# Patient Record
Sex: Male | Born: 1970 | Race: White | Hispanic: No | Marital: Single | Smoking: Current every day smoker
Health system: Southern US, Community
[De-identification: ages and names within clinical notes are randomized; demographics above are authoritative.]

## PROBLEM LIST (undated history)

## (undated) HISTORY — PX: BRAIN SURGERY: SHX531

---

## 2009-02-19 ENCOUNTER — Emergency Department: Payer: Self-pay | Admitting: Emergency Medicine

## 2009-02-20 ENCOUNTER — Emergency Department: Payer: Self-pay | Admitting: Internal Medicine

## 2009-10-26 ENCOUNTER — Emergency Department: Payer: Self-pay | Admitting: Internal Medicine

## 2011-09-08 ENCOUNTER — Emergency Department: Payer: Self-pay | Admitting: Internal Medicine

## 2013-02-03 IMAGING — CT CT HEAD WITHOUT CONTRAST
2 series · 16 of 30 positions shown, 20 images · non-contrast
Comparison: none

REASON FOR EXAM: Headache/ vomiting and photophobia        Flex 4
COMMENTS:   LMP: (Male)

PROCEDURE:     CT  - CT HEAD WITHOUT CONTRAST  - September 08, 2011 [DATE]
RESULT:     Technique: Helical 5mm sections were obtained from the skull
base to the vertex without administration of intravenous contrast.

[Series 2: without · axial · non-contrast · 0.40mm/px · z∈[+533,+658]mm · 13 of 31 slices shown, 17 images]
[im 3/31  brain]
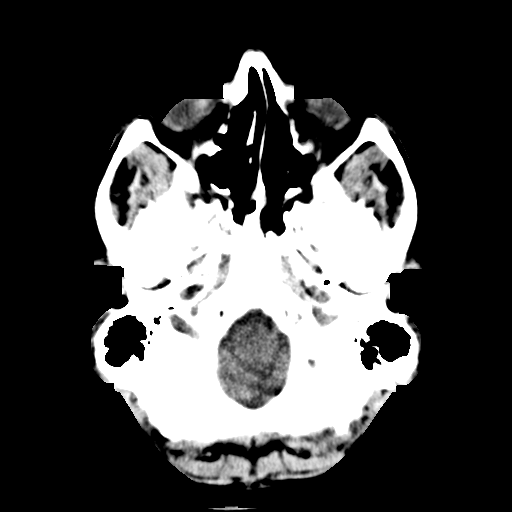
[im 3/31  bone]
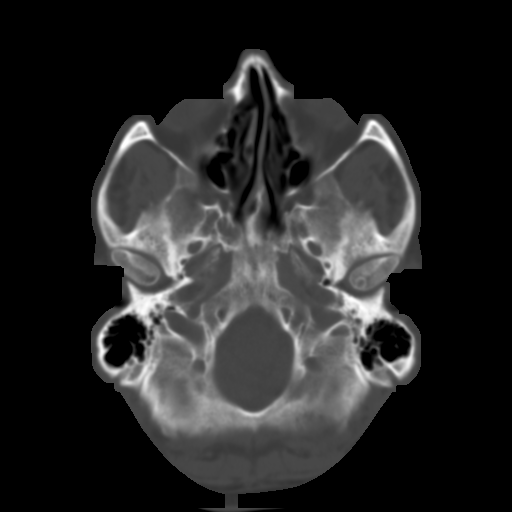
[im 5/31  brain]
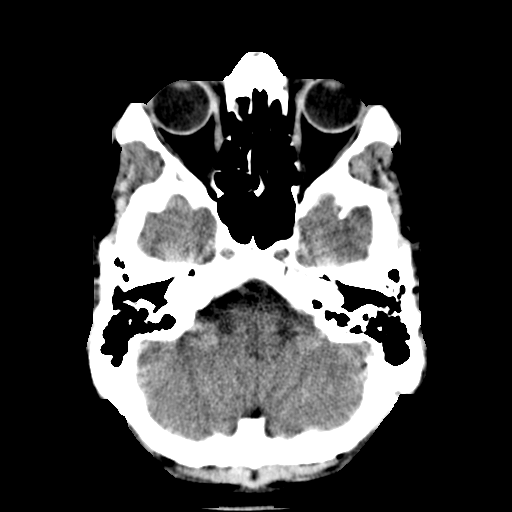
[im 7/31  brain]
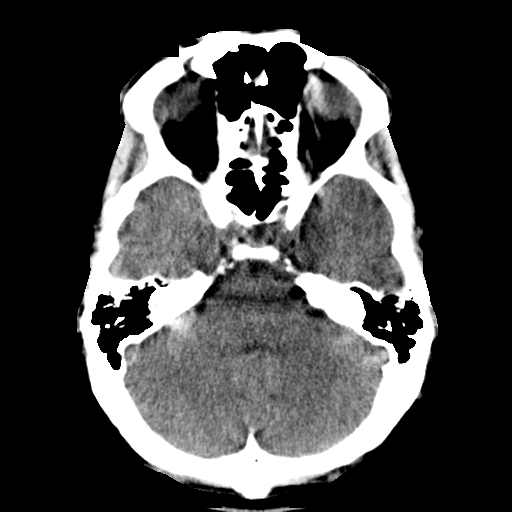
[im 9/31  brain]
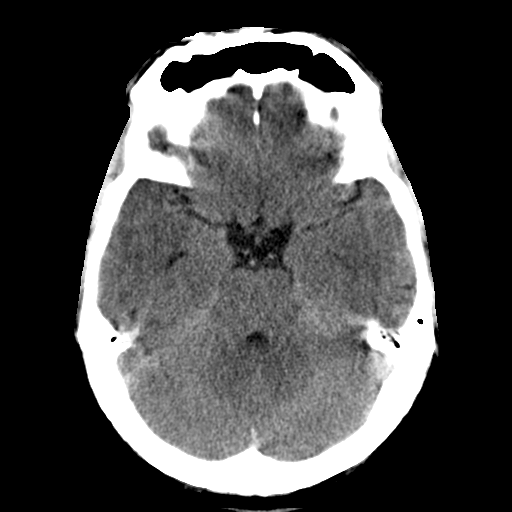
[im 11/31  brain]
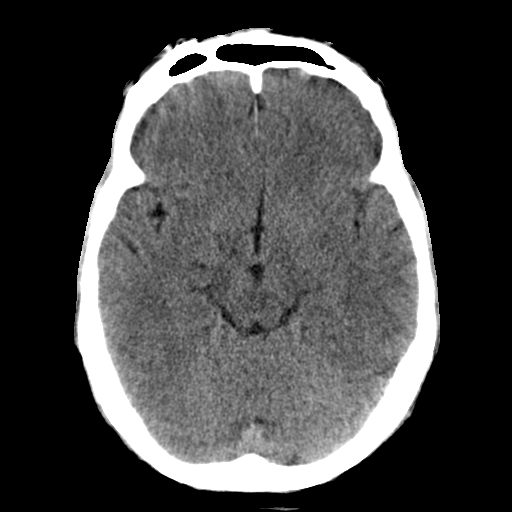
[im 11/31  bone]
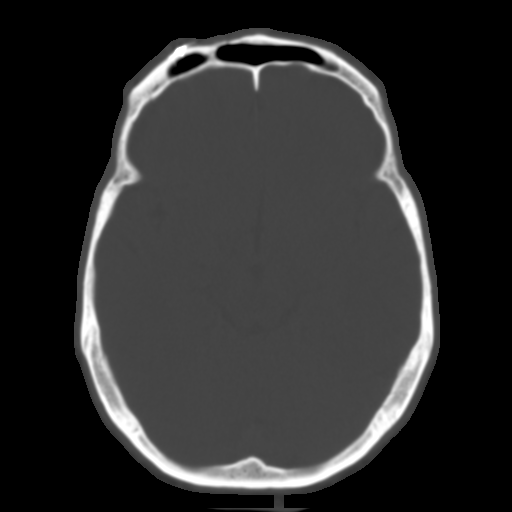
[im 13/31  brain]
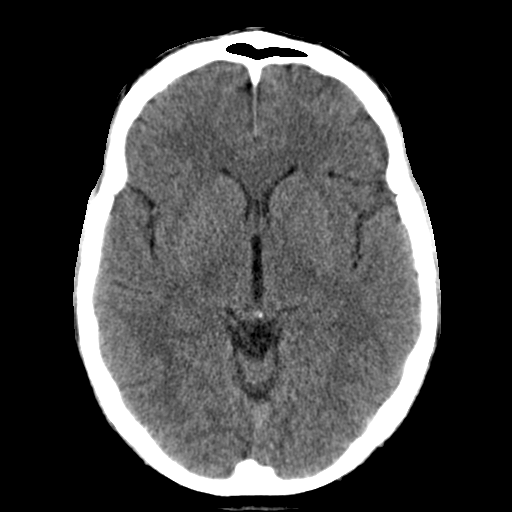
[im 16/31  brain]
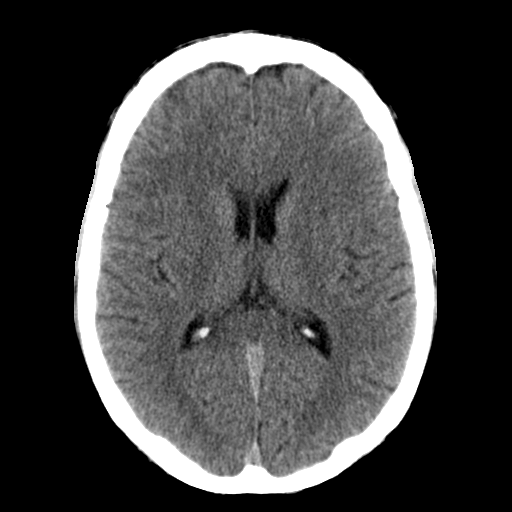
[im 18/31  brain]
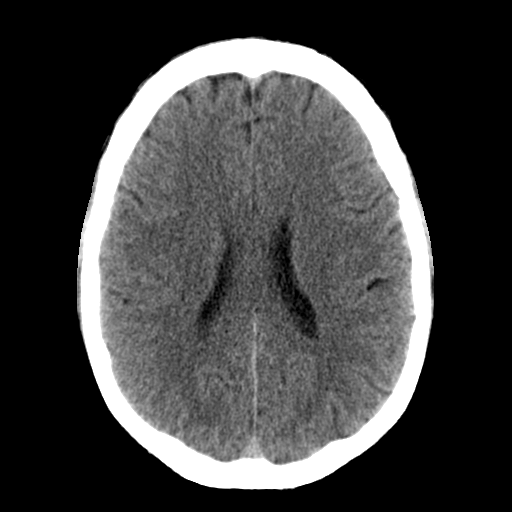
[im 20/31  brain]
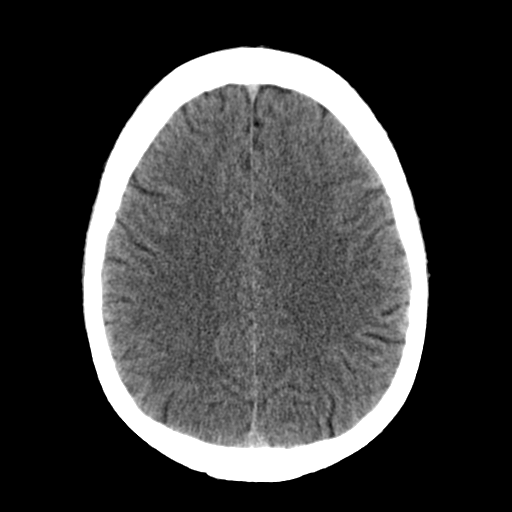
[im 20/31  bone]
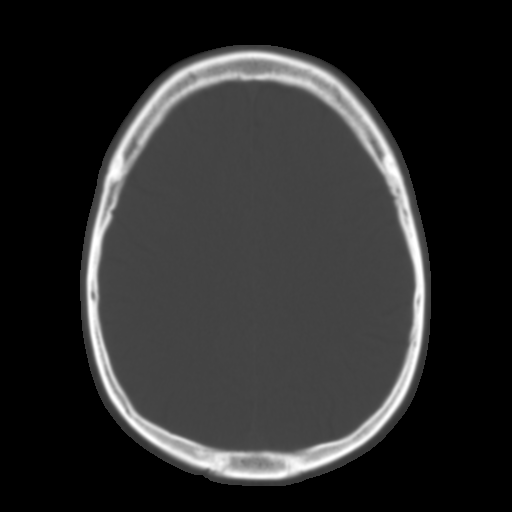
[im 22/31  brain]
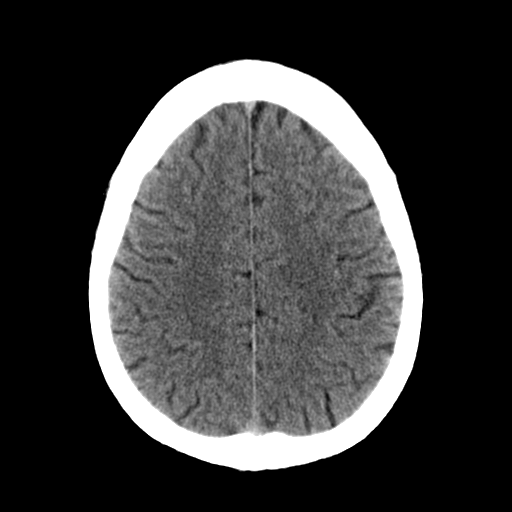
[im 24/31  brain]
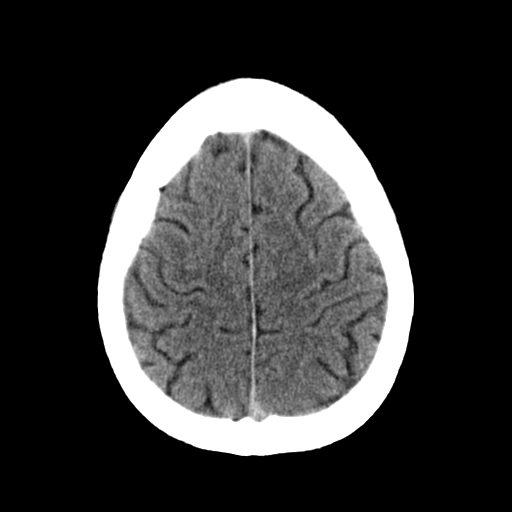
[im 26/31  brain]
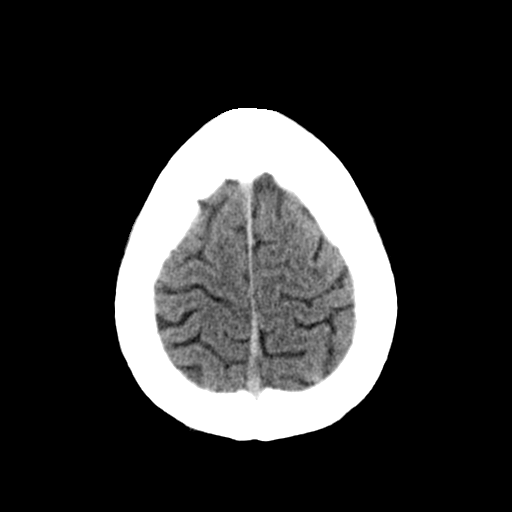
[im 28/31  brain]
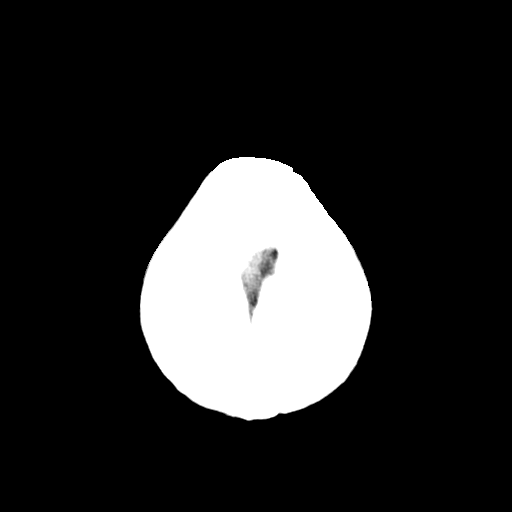
[im 28/31  bone]
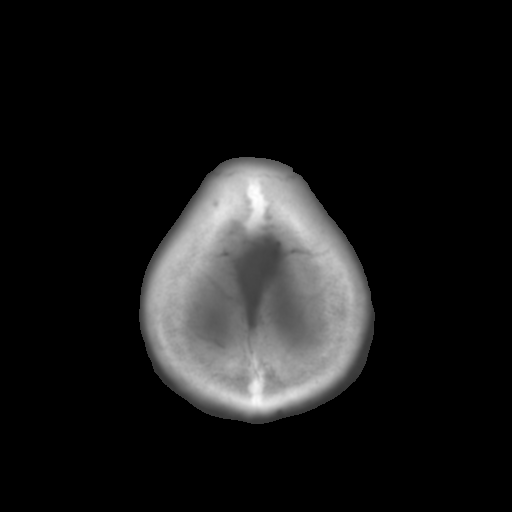

[Series 3: bone · axial · 0.40mm/px · z∈[+533,+573]mm · 3 of 31 slices shown]
[im 3/31  bone]
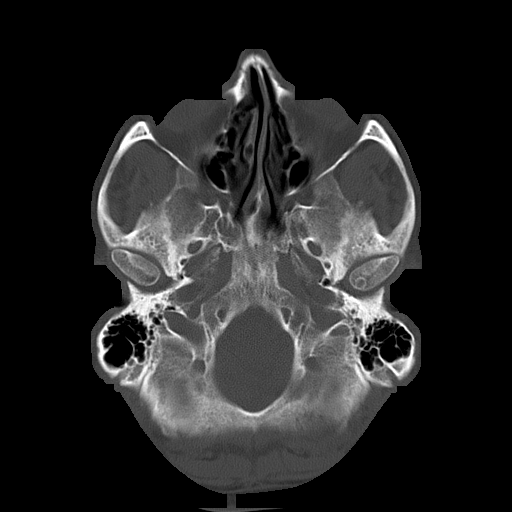
[im 7/31  bone]
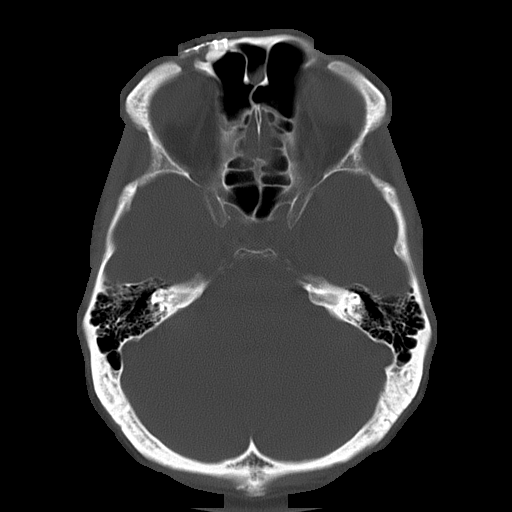
[im 11/31  bone]
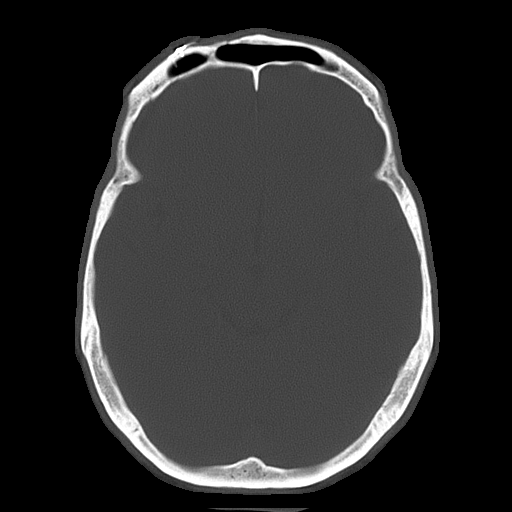

[16 of 30 positions shown; findings below may reference images not displayed]

FINDINGS: There is not evidence of intra-axial fluid collections. There is
no evidence of acute hemorrhage or secondary signs reflecting mass effect or
subacute or chronic focal territorial infarction. The osseous structures
demonstrate no evidence of a depressed skull fracture. Plate and screw
fixation is identified within the right supraorbital region. These findings
appear chronic. If there is persistent concern clinical follow-up with MRI
is recommended.
IMPRESSION: 1. No evidence of acute intracranial abnormalitites.

## 2016-09-15 ENCOUNTER — Emergency Department
Admission: EM | Admit: 2016-09-15 | Discharge: 2016-09-15 | Disposition: A | Discharge status: Home / Self Care | Payer: Self-pay | Attending: Emergency Medicine | Admitting: Emergency Medicine

## 2016-09-15 ENCOUNTER — Encounter: Payer: Self-pay | Admitting: Emergency Medicine

## 2016-09-15 ENCOUNTER — Emergency Department: Payer: Self-pay

## 2016-09-15 DIAGNOSIS — Y999 Unspecified external cause status: Secondary | ICD-10-CM | POA: Insufficient documentation

## 2016-09-15 DIAGNOSIS — Y9389 Activity, other specified: Secondary | ICD-10-CM | POA: Insufficient documentation

## 2016-09-15 DIAGNOSIS — Z23 Encounter for immunization: Secondary | ICD-10-CM | POA: Insufficient documentation

## 2016-09-15 DIAGNOSIS — S61412A Laceration without foreign body of left hand, initial encounter: Secondary | ICD-10-CM | POA: Insufficient documentation

## 2016-09-15 DIAGNOSIS — W231XXA Caught, crushed, jammed, or pinched between stationary objects, initial encounter: Secondary | ICD-10-CM | POA: Insufficient documentation

## 2016-09-15 DIAGNOSIS — Y929 Unspecified place or not applicable: Secondary | ICD-10-CM | POA: Insufficient documentation

## 2016-09-15 DIAGNOSIS — F17200 Nicotine dependence, unspecified, uncomplicated: Secondary | ICD-10-CM | POA: Insufficient documentation

## 2016-09-15 MED ORDER — TETANUS-DIPHTH-ACELL PERTUSSIS 5-2.5-18.5 LF-MCG/0.5 IM SUSP
0.5000 mL | Freq: Once | INTRAMUSCULAR | Status: AC
  Administered 2016-09-15: 0.5 mL via INTRAMUSCULAR
  Filled 2016-09-15: qty 0.5

## 2016-09-15 MED ORDER — HYDROCODONE-ACETAMINOPHEN 5-325 MG PO TABS: 1.0000 | ORAL_TABLET | Freq: Four times a day (QID) | ORAL | 0 refills | Status: AC | PRN

## 2016-09-15 MED ORDER — CEPHALEXIN 500 MG PO CAPS: 500.0000 mg | ORAL_CAPSULE | Freq: Three times a day (TID) | ORAL | 0 refills | Status: AC

## 2016-09-15 MED ORDER — LIDOCAINE HCL (PF) 1 % IJ SOLN
10.0000 mL | Freq: Once | INTRAMUSCULAR | Status: AC
  Administered 2016-09-15: 10 mL
  Filled 2016-09-15: qty 10

## 2016-09-15 MED ORDER — HYDROCODONE-ACETAMINOPHEN 5-325 MG PO TABS
1.0000 | ORAL_TABLET | Freq: Once | ORAL | Status: AC
  Administered 2016-09-15: 1 via ORAL
  Filled 2016-09-15: qty 1

## 2016-09-15 NOTE — ED Triage Notes (Addendum)
Laceration to left hand.  Says hand got caught between a ground rod and a driver.  Does not want to file worker comp

## 2016-09-15 NOTE — ED Provider Notes (Signed)
[facility] Emergency Department Provider Note  ____________________________________________  Time seen: Approximately 3:20 PM  I have reviewed the triage vital signs and the nursing notes.   HISTORY  Chief Complaint Extremity Laceration    HPI Trevor FaceKenneth R Buehl Jr. is a 45 y.o. male , NAD, presents to the emergency department for treatment of a laceration about his left hand. States he was working with a friend when his left hand was caught between a ground rod in her driver. States he was using the driver to force it requires into the ground when he struck his left hand. This is the cause a significant laceration and he presents for evaluation. Last tetanus vaccination was "years ago". Has had full range of motion of the left hand and fingers since the incident. Denies any numbness, weakness, tingling. Attempted to cleanse the wound after the incident occurred.   History reviewed. No pertinent past medical history.  There are no active problems to display for this patient.   Past Surgical History:  Procedure Laterality Date  . BRAIN SURGERY      Prior to Admission medications   Medication Sig Start Date End Date Taking? Authorizing Provider  cephALEXin (KEFLEX) 500 MG capsule Take 1 capsule (500 mg total) by mouth 3 (three) times daily. 09/15/16   Jami L Hagler, PA-C  HYDROcodone-acetaminophen (NORCO) 5-325 MG tablet Take 1 tablet by mouth every 6 (six) hours as needed for severe pain. 09/15/16   Jami L Hagler, PA-C    Allergies Patient has no known allergies.  No family history on file.  Social History Social History  Substance Use Topics  . Smoking status: Current Every Day Smoker  . Smokeless tobacco: Never Used  . Alcohol use Yes     Review of Systems  Constitutional: No fever/chills Musculoskeletal: Positive left hand pain.  Skin: Positive laceration left hand. Negative for rash, redness, swelling, bruising. Neurological: Negative for  Numbness, weakness, tingling.  ____________________________________________   PHYSICAL EXAM:  VITAL SIGNS: ED Triage Vitals  Enc Vitals Group     BP 09/15/16 1406 129/83     Pulse Rate 09/15/16 1406 85     Resp 09/15/16 1406 18     Temp 09/15/16 1406 98 F (36.7 C)     Temp Source 09/15/16 1406 Oral     SpO2 09/15/16 1406 96 %     Weight 09/15/16 1407 150 lb (68 kg)     Height 09/15/16 1407 5\' 9"  (1.753 m)     Head Circumference --      Peak Flow --      Pain Score 09/15/16 1407 5     Pain Loc --      Pain Edu? --      Excl. in GC? --      Constitutional: Alert and oriented. Well appearing and in no acute distress. Eyes: Conjunctivae are normal.  Head: Atraumatic. Cardiovascular: Good peripheral circulation with 2+ pulses noted in the left upper extremity. Capillary refill is brisk in all digits of left hand. Respiratory: Normal respiratory effort without tachypnea or retractions.  Musculoskeletal: Full range of motion of the left hand and all digits of the left hand without pain or difficulty. Strength of all digits on the left hand is 5 out of 5. Neurologic:  Normal speech and language. No gross focal neurologic deficits are appreciated. Sensation to light touch grossly intact to all digits on left hand. Skin:  10 cm U-shaped, flap laceration about the thenar eminence of  the left hand. Bleeding is controlled. No foreign bodies are visualized or palpated in the wound. Skin is warm, dry. No rash noted. Psychiatric: Mood and affect are normal. Speech and behavior are normal. Patient exhibits appropriate insight and judgement.   ____________________________________________   LABS  None ____________________________________________  EKG  None ____________________________________________  RADIOLOGY I, Hope PigeonJami L Hagler, personally viewed and evaluated these images (plain radiographs) as part of my medical decision making, as well as reviewing the written report by the  radiologist.  Dg Hand Complete Left  Result Date: 09/15/2016 CLINICAL DATA:  Laceration. EXAM: LEFT HAND - COMPLETE 3+ VIEW COMPARISON:  No recent prior. FINDINGS: No acute bony abnormality identified. No evidence of fracture dislocation. Soft tissue laceration noted over the soft tissues between the first and second digits. No radiopaque foreign body. IMPRESSION: Soft tissue laceration. No radiopaque foreign body. No acute bony abnormality . Electronically Signed   By: Maisie Fushomas  Register   On: 09/15/2016 15:56    ____________________________________________    PROCEDURES  Procedure(s) performed: None   .Marland Kitchen.Laceration Repair Date/Time: 09/15/2016 5:15 PM Performed by: Hope PigeonHAGLER, JAMI L Authorized by: Hope PigeonHAGLER, JAMI L   Consent:    Consent obtained:  Verbal   Consent given by:  Patient   Risks discussed:  Infection, pain and poor cosmetic result   Alternatives discussed:  No treatment Anesthesia (see MAR for exact dosages):    Anesthesia method:  Local infiltration   Local anesthetic:  Lidocaine 1% w/o epi Laceration details:    Location:  Hand   Hand location:  L palm   Length (cm):  10   Depth (mm):  6 Repair type:    Repair type:  Simple Pre-procedure details:    Preparation:  Patient was prepped and draped in usual sterile fashion and imaging obtained to evaluate for foreign bodies Exploration:    Hemostasis achieved with:  Direct pressure   Wound exploration: wound explored through full range of motion and entire depth of wound probed and visualized     Wound extent: areolar tissue violated and fascia violated     Wound extent: no foreign bodies/material noted, no muscle damage noted, no nerve damage noted, no tendon damage noted, no underlying fracture noted and no vascular damage noted     Contaminated: no   Treatment:    Area cleansed with:  Betadine   Amount of cleaning:  Standard   Irrigation solution:  Sterile saline   Irrigation volume:  100mL   Irrigation method:   Pressure wash   Foreign body removal: No foreign bodies.   Skin repair:    Repair method:  Sutures   Suture size:  4-0   Suture material:  Nylon   Suture technique:  Simple interrupted   Number of sutures:  13 Approximation:    Approximation:  Close   Vermilion border: well-aligned   Post-procedure details:    Dressing:  Non-adherent dressing   Patient tolerance of procedure:  Tolerated well, no immediate complications     Medications  Tdap (BOOSTRIX) injection 0.5 mL (0.5 mLs Intramuscular Given 09/15/16 1531)  HYDROcodone-acetaminophen (NORCO/VICODIN) 5-325 MG per tablet 1 tablet (1 tablet Oral Given 09/15/16 1532)  lidocaine (PF) (XYLOCAINE) 1 % injection 10 mL (10 mLs Infiltration Given 09/15/16 1532)     ____________________________________________   INITIAL IMPRESSION / ASSESSMENT AND PLAN / ED COURSE  Pertinent labs & imaging results that were available during my care of the patient were reviewed by me and considered in my medical decision making (  see chart for details).  Clinical Course     Patient's diagnosis is consistent with Laceration of the left hand without foreign body and need for tetanus booster. Patient was given pain medication as well as a tetanus booster while in the emergency department and tolerated well without immediate side effects. Patient is to keep the wound clean and dry over the next 48 hours and then may cleanse lightly with warm soapy water. Patient will be discharged home with prescriptions for Keflex and Norco to take as directed. Patient is to follow up with [facility] in 48 hours for wound recheck and then again in 7-10 days for suture removal. Patient is given ED precautions to return to the ED for any worsening or new symptoms.    ____________________________________________  FINAL CLINICAL IMPRESSION(S) / ED DIAGNOSES  Final diagnoses:  Laceration of left hand without foreign body, initial encounter  Need for tetanus booster       NEW MEDICATIONS STARTED DURING THIS VISIT:  Discharge Medication List as of 09/15/2016  5:19 PM    START taking these medications   Details  cephALEXin (KEFLEX) 500 MG capsule Take 1 capsule (500 mg total) by mouth 3 (three) times daily., Starting Thu 09/15/2016, Print    HYDROcodone-acetaminophen (NORCO) 5-325 MG tablet Take 1 tablet by mouth every 6 (six) hours as needed for severe pain., Starting Thu 09/15/2016, Print             Hope Pigeon, PA-C 09/15/16 2234    Nita Sickle, MD 09/18/16 1721

## 2018-02-11 IMAGING — DX DG HAND COMPLETE 3+V*L*
3 series · 3 of 3 positions shown · non-contrast
Comparison: No recent prior.

CLINICAL DATA: Laceration.

EXAM:
LEFT HAND - COMPLETE 3+ VIEW

[hand ap]
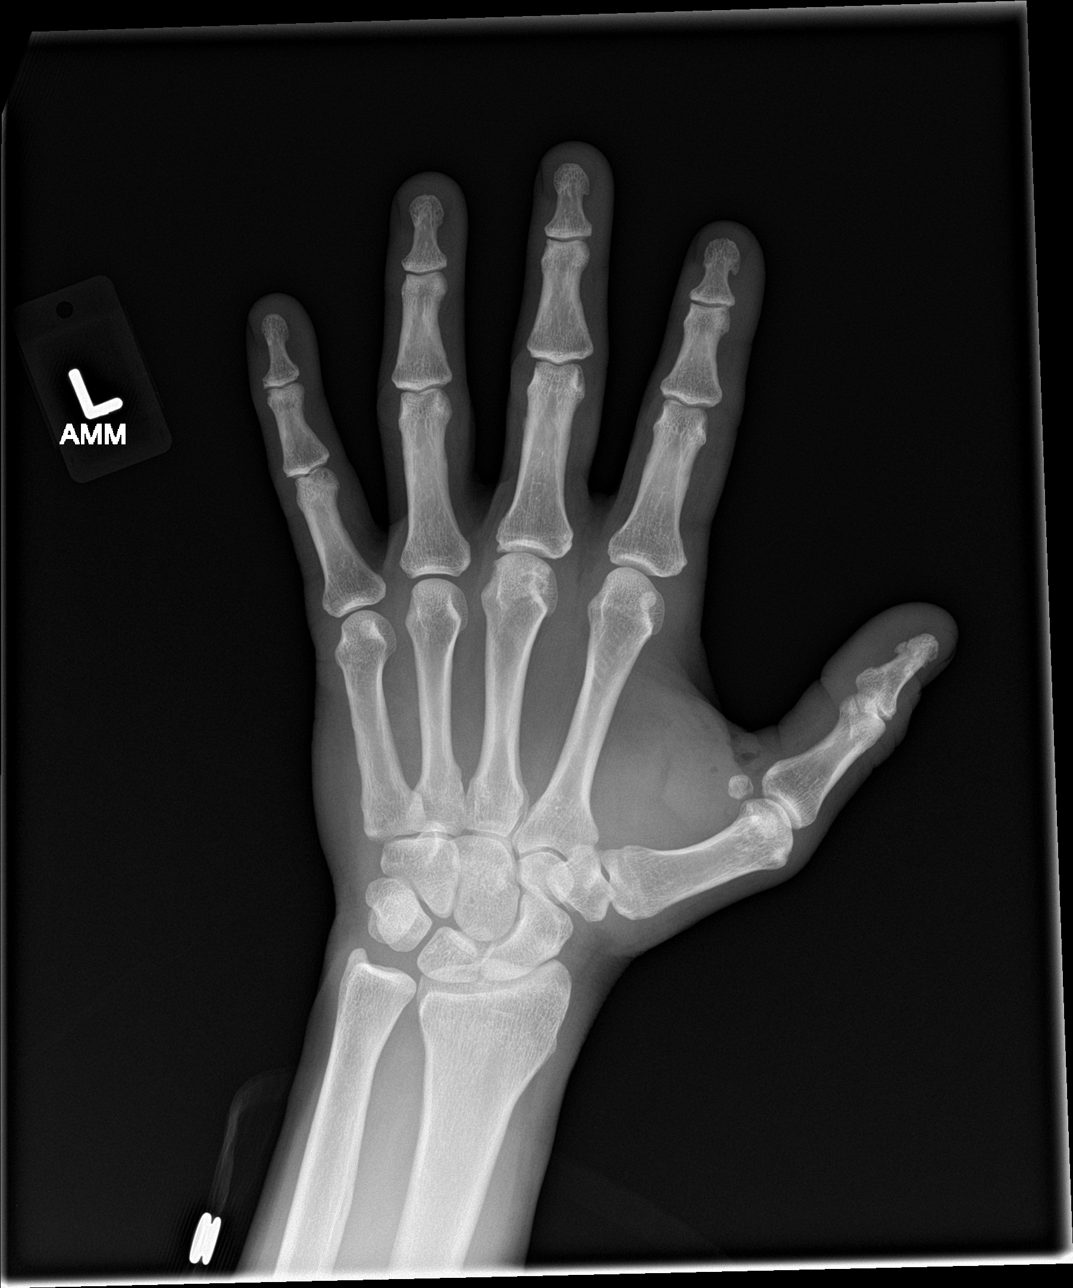

[hand obl]
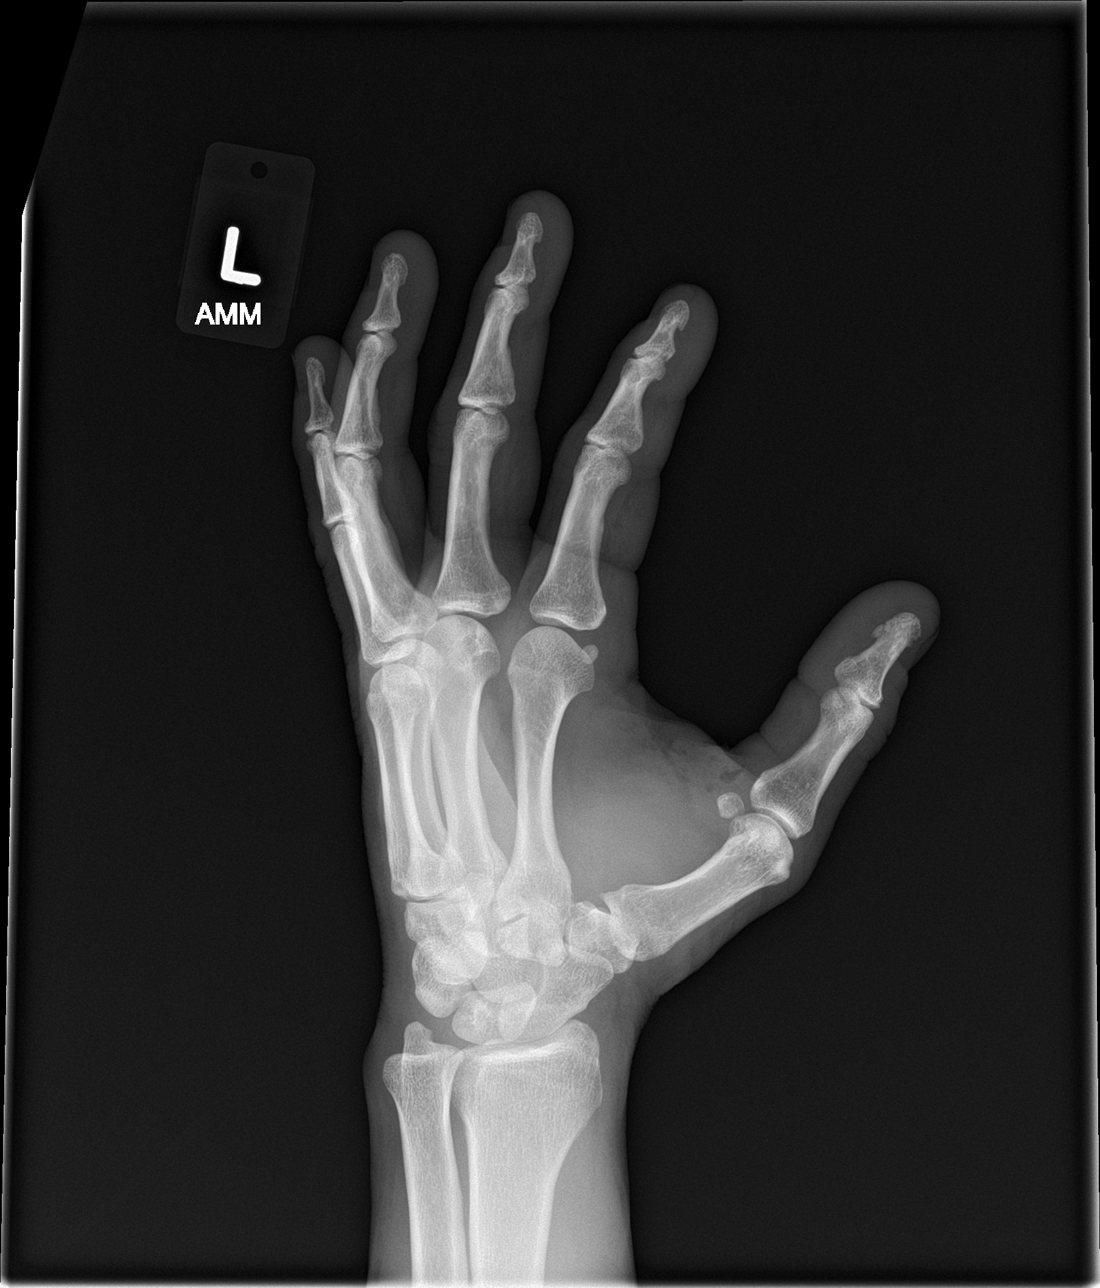

[hand lat]
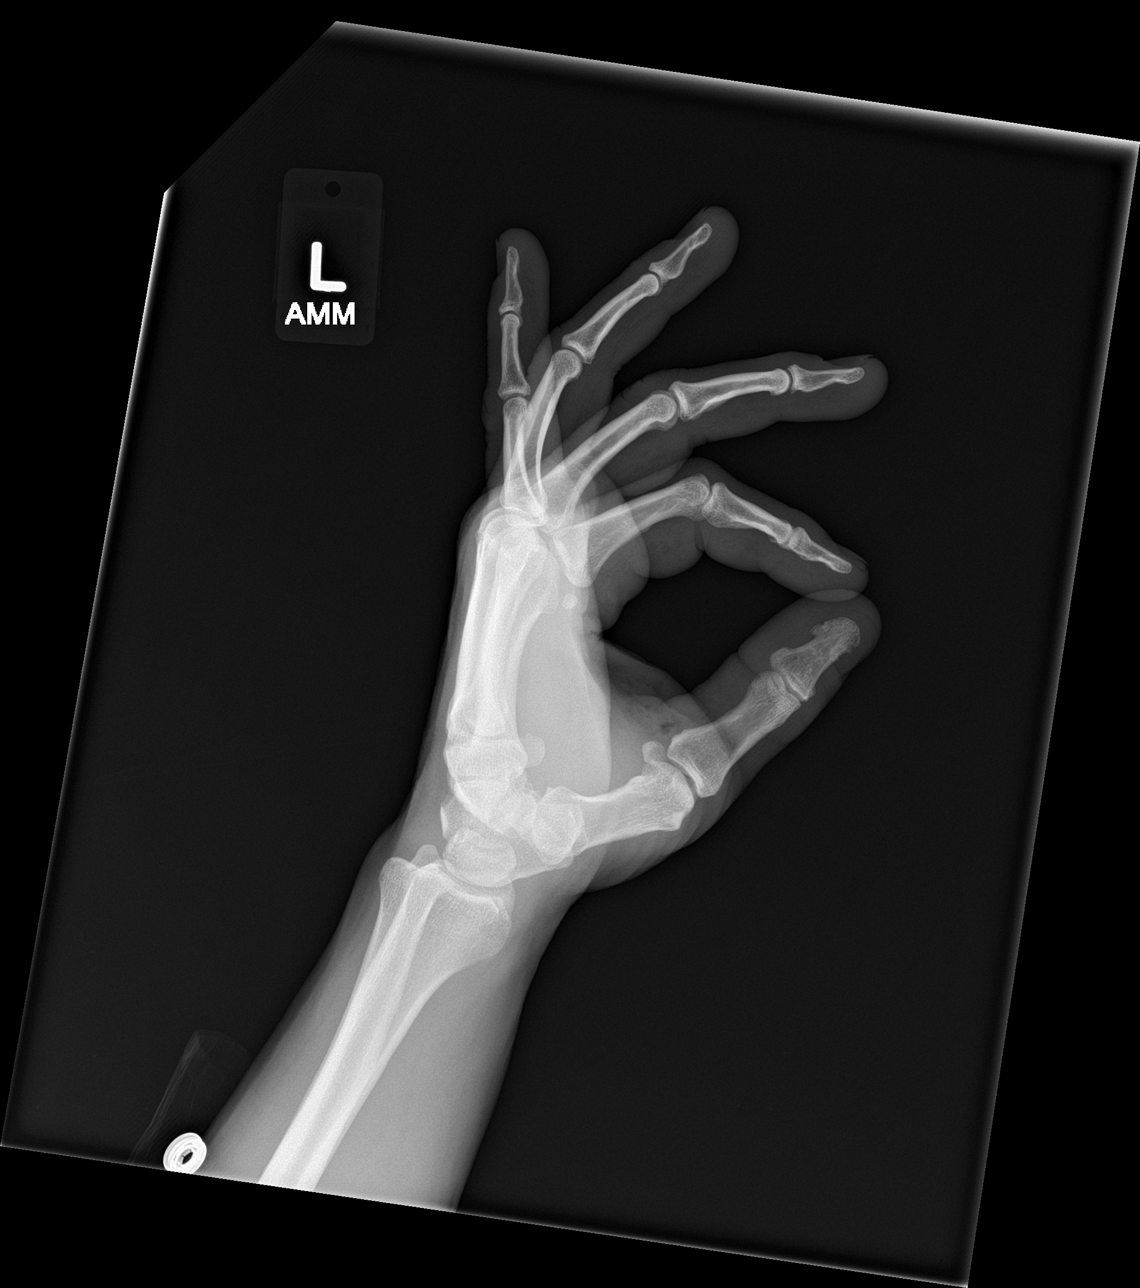

[3 of 3 positions shown; findings below may reference images not displayed]

FINDINGS: No acute bony abnormality identified. No evidence of fracture
dislocation. Soft tissue laceration noted over the soft tissues
between the first and second digits. No radiopaque foreign body.
IMPRESSION: Soft tissue laceration. No radiopaque foreign body. No acute bony
abnormality .
